# Patient Record
Sex: Female | Born: 1960 | Race: White | Hispanic: No | Marital: Married
Health system: Southern US, Community
[De-identification: ages and names within clinical notes are randomized; demographics above are authoritative.]

---

## 2012-11-30 ENCOUNTER — Inpatient Hospital Stay: Payer: Self-pay | Admitting: Internal Medicine

## 2012-11-30 LAB — CBC
MCH: 23.1 pg — ABNORMAL LOW (ref 26.0–34.0)
MCV: 73 fL — ABNORMAL LOW (ref 80–100)
Platelet: 502 10*3/uL — ABNORMAL HIGH (ref 150–440)
WBC: 7.8 10*3/uL (ref 3.6–11.0)

## 2012-11-30 LAB — BASIC METABOLIC PANEL
Anion Gap: 8 (ref 7–16)
Chloride: 106 mmol/L (ref 98–107)
Co2: 24 mmol/L (ref 21–32)
EGFR (African American): >60
EGFR (Non-African Amer.): >60
Glucose: 89 mg/dL (ref 65–99)
Osmolality: 273 (ref 275–301)
Sodium: 138 mmol/L (ref 136–145)

## 2012-11-30 LAB — APTT: Activated PTT: 39.4 secs — ABNORMAL HIGH (ref 23.6–35.9)

## 2012-11-30 LAB — CK TOTAL AND CKMB (NOT AT ARMC)
CK, Total: 174 U/L (ref 21–215)
CK-MB: 16.4 ng/mL — ABNORMAL HIGH (ref 0.5–3.6)

## 2012-11-30 LAB — TROPONIN I
Troponin-I: 1.1 ng/mL — ABNORMAL HIGH
Troponin-I: 4.8 ng/mL — ABNORMAL HIGH

## 2012-11-30 LAB — PROTIME-INR: INR: 1

## 2012-12-01 LAB — CBC WITH DIFFERENTIAL/PLATELET
Basophil #: 0 10*3/uL (ref 0.0–0.1)
Basophil %: 0.6 %
Eosinophil %: 2.5 %
HGB: 9 g/dL — ABNORMAL LOW (ref 12.0–16.0)
Lymphocyte %: 31.4 %
MCH: 23.1 pg — ABNORMAL LOW (ref 26.0–34.0)
MCV: 75 fL — ABNORMAL LOW (ref 80–100)
Monocyte %: 8.8 %
Neutrophil %: 56.7 %
Platelet: 460 10*3/uL — ABNORMAL HIGH (ref 150–440)
WBC: 8.4 10*3/uL (ref 3.6–11.0)

## 2012-12-01 LAB — HEMOGLOBIN A1C: Hemoglobin A1C: 5.3 % (ref 4.2–6.3)

## 2012-12-01 LAB — APTT
Activated PTT: >160 secs (ref 23.6–35.9)
Activated PTT: 79.8 secs — ABNORMAL HIGH (ref 23.6–35.9)

## 2012-12-01 LAB — BASIC METABOLIC PANEL
Anion Gap: 4 — ABNORMAL LOW (ref 7–16)
BUN: 8 mg/dL (ref 7–18)
Calcium, Total: 8.6 mg/dL (ref 8.5–10.1)
EGFR (African American): >60
Glucose: 115 mg/dL — ABNORMAL HIGH (ref 65–99)
Potassium: 4.3 mmol/L (ref 3.5–5.1)
Sodium: 137 mmol/L (ref 136–145)

## 2012-12-01 LAB — LIPID PANEL
HDL Cholesterol: 39 mg/dL — ABNORMAL LOW (ref 40–60)
Ldl Cholesterol, Calc: 71 mg/dL (ref 0–100)

## 2012-12-01 LAB — TROPONIN I: Troponin-I: 1.7 ng/mL — ABNORMAL HIGH

## 2012-12-01 LAB — CK TOTAL AND CKMB (NOT AT ARMC): CK-MB: 10.6 ng/mL — ABNORMAL HIGH (ref 0.5–3.6)

## 2012-12-19 LAB — COMPREHENSIVE METABOLIC PANEL
Alkaline Phosphatase: 113 U/L (ref 50–136)
BUN: 10 mg/dL (ref 7–18)
Calcium, Total: 8.7 mg/dL (ref 8.5–10.1)
Chloride: 111 mmol/L — ABNORMAL HIGH (ref 98–107)
Co2: 21 mmol/L (ref 21–32)
Creatinine: 0.86 mg/dL (ref 0.60–1.30)
EGFR (Non-African Amer.): >60
Glucose: 84 mg/dL (ref 65–99)
Osmolality: 281 (ref 275–301)
SGPT (ALT): 17 U/L (ref 12–78)
Sodium: 142 mmol/L (ref 136–145)
Total Protein: 7.1 g/dL (ref 6.4–8.2)

## 2012-12-19 LAB — CBC
HGB: 9 g/dL — ABNORMAL LOW (ref 12.0–16.0)
MCHC: 32.1 g/dL (ref 32.0–36.0)
MCV: 73 fL — ABNORMAL LOW (ref 80–100)
Platelet: 539 10*3/uL — ABNORMAL HIGH (ref 150–440)
RBC: 3.84 10*6/uL (ref 3.80–5.20)
WBC: 7.6 10*3/uL (ref 3.6–11.0)

## 2012-12-19 LAB — PROTIME-INR
INR: 1
Prothrombin Time: 13.8 secs (ref 11.5–14.7)

## 2012-12-19 LAB — CK TOTAL AND CKMB (NOT AT ARMC)
CK, Total: 52 U/L (ref 21–215)
CK-MB: 0.7 ng/mL (ref 0.5–3.6)

## 2012-12-19 LAB — APTT: Activated PTT: 39.3 secs — ABNORMAL HIGH (ref 23.6–35.9)

## 2012-12-20 ENCOUNTER — Observation Stay: Payer: Self-pay | Admitting: Internal Medicine

## 2012-12-20 LAB — LIPID PANEL
HDL Cholesterol: 36 mg/dL — ABNORMAL LOW (ref 40–60)
Ldl Cholesterol, Calc: 81 mg/dL (ref 0–100)

## 2012-12-20 LAB — TROPONIN I
Troponin-I: 0.03 ng/mL
Troponin-I: <0.02 ng/mL

## 2012-12-20 LAB — CBC WITH DIFFERENTIAL/PLATELET
Basophil #: 0.1 10*3/uL (ref 0.0–0.1)
Eosinophil #: 0.1 10*3/uL (ref 0.0–0.7)
Eosinophil %: 0.6 %
HCT: 26.3 % — ABNORMAL LOW (ref 35.0–47.0)
HGB: 8.3 g/dL — ABNORMAL LOW (ref 12.0–16.0)
Lymphocyte #: 1.3 10*3/uL (ref 1.0–3.6)
MCH: 23.3 pg — ABNORMAL LOW (ref 26.0–34.0)
MCHC: 31.6 g/dL — ABNORMAL LOW (ref 32.0–36.0)
MCV: 74 fL — ABNORMAL LOW (ref 80–100)
Monocyte #: 0.6 x10 3/mm (ref 0.2–0.9)
Neutrophil %: 82 %
Platelet: 516 10*3/uL — ABNORMAL HIGH (ref 150–440)
RDW: 20.8 % — ABNORMAL HIGH (ref 11.5–14.5)
WBC: 11.1 10*3/uL — ABNORMAL HIGH (ref 3.6–11.0)

## 2012-12-20 LAB — BASIC METABOLIC PANEL
Anion Gap: 6 — ABNORMAL LOW (ref 7–16)
BUN: 8 mg/dL (ref 7–18)
Chloride: 110 mmol/L — ABNORMAL HIGH (ref 98–107)
Co2: 22 mmol/L (ref 21–32)
Creatinine: 0.63 mg/dL (ref 0.60–1.30)
EGFR (Non-African Amer.): >60
Glucose: 111 mg/dL — ABNORMAL HIGH (ref 65–99)
Osmolality: 275 (ref 275–301)
Potassium: 4.5 mmol/L (ref 3.5–5.1)
Sodium: 138 mmol/L (ref 136–145)

## 2012-12-20 LAB — CK TOTAL AND CKMB (NOT AT ARMC): CK, Total: 45 U/L (ref 21–215)

## 2013-01-16 ENCOUNTER — Observation Stay: Payer: Self-pay | Admitting: Internal Medicine

## 2013-01-16 LAB — TROPONIN I
Troponin-I: <0.02 ng/mL
Troponin-I: <0.02 ng/mL

## 2013-01-16 LAB — CBC
HCT: 30.1 % — ABNORMAL LOW (ref 35.0–47.0)
HGB: 9.5 g/dL — ABNORMAL LOW (ref 12.0–16.0)
MCH: 23.1 pg — ABNORMAL LOW (ref 26.0–34.0)
Platelet: 570 10*3/uL — ABNORMAL HIGH (ref 150–440)
RBC: 4.1 10*6/uL (ref 3.80–5.20)
RDW: 21.6 % — ABNORMAL HIGH (ref 11.5–14.5)

## 2013-01-16 LAB — DRUG SCREEN, URINE
Amphetamines, Ur Screen: NEGATIVE (ref ?–1000)
Cocaine Metabolite,Ur ~~LOC~~: NEGATIVE (ref ?–300)
MDMA (Ecstasy)Ur Screen: NEGATIVE (ref ?–500)
Opiate, Ur Screen: POSITIVE (ref ?–300)
Phencyclidine (PCP) Ur S: NEGATIVE (ref ?–25)

## 2013-01-16 LAB — URINALYSIS, COMPLETE
Bilirubin,UR: NEGATIVE
Blood: NEGATIVE
Ketone: NEGATIVE
Nitrite: NEGATIVE
Ph: 5 (ref 4.5–8.0)
Protein: NEGATIVE
RBC,UR: 12 /HPF (ref 0–5)
Specific Gravity: 1.043 (ref 1.003–1.030)
Squamous Epithelial: 8

## 2013-01-16 LAB — CK TOTAL AND CKMB (NOT AT ARMC)
CK, Total: 45 U/L (ref 21–215)
CK, Total: 42 U/L (ref 21–215)
CK-MB: 0.5 ng/mL (ref 0.5–3.6)
CK-MB: 0.6 ng/mL (ref 0.5–3.6)

## 2013-01-16 LAB — BASIC METABOLIC PANEL
Co2: 23 mmol/L (ref 21–32)
Creatinine: 0.97 mg/dL (ref 0.60–1.30)
EGFR (African American): >60
EGFR (Non-African Amer.): >60
Glucose: 82 mg/dL (ref 65–99)
Osmolality: 276 (ref 275–301)
Potassium: 3.7 mmol/L (ref 3.5–5.1)

## 2013-01-16 LAB — ETHANOL: Ethanol %: <0.003 % (ref 0.000–0.080)

## 2013-01-17 LAB — CBC WITH DIFFERENTIAL/PLATELET
Basophil #: 0.1 x10 3/mm 3
Basophil %: 0.6 %
Eosinophil #: 0.1 x10 3/mm 3
Eosinophil %: 1.3 %
HCT: 29.6 % — ABNORMAL LOW
HGB: 9.4 g/dL — ABNORMAL LOW
Lymphocyte %: 25.2 %
Lymphs Abs: 2.5 x10 3/mm 3
MCH: 23.3 pg — ABNORMAL LOW
MCHC: 31.7 g/dL — ABNORMAL LOW
MCV: 74 fL — ABNORMAL LOW
Monocyte #: 0.7 "x10 3/mm "
Monocyte %: 7.2 %
Neutrophil #: 6.6 x10 3/mm 3 — ABNORMAL HIGH
Neutrophil %: 65.7 %
Platelet: 551 x10 3/mm 3 — ABNORMAL HIGH
RBC: 4.02 X10 6/mm 3
RDW: 20.9 % — ABNORMAL HIGH
WBC: 10 x10 3/mm 3

## 2013-01-17 LAB — BASIC METABOLIC PANEL
Anion Gap: 4 — ABNORMAL LOW (ref 7–16)
BUN: 8 mg/dL (ref 7–18)
Calcium, Total: 9.1 mg/dL (ref 8.5–10.1)
Chloride: 106 mmol/L (ref 98–107)
Creatinine: 0.61 mg/dL (ref 0.60–1.30)
EGFR (African American): >60
EGFR (Non-African Amer.): >60
Glucose: 86 mg/dL (ref 65–99)
Osmolality: 268 (ref 275–301)

## 2013-01-17 LAB — LIPID PANEL
Cholesterol: 130 mg/dL
HDL Cholesterol: 42 mg/dL
Ldl Cholesterol, Calc: 76 mg/dL
Triglycerides: 60 mg/dL
VLDL Cholesterol, Calc: 12 mg/dL

## 2013-01-17 LAB — MAGNESIUM: Magnesium: 1.8 mg/dL

## 2014-03-01 IMAGING — CT CT HEAD WITHOUT CONTRAST
1 series · 16 of 30 positions shown, 20 images · non-contrast
Comparison: none

REASON FOR EXAM: large hematoma to right orbit region with severe
headache post fall 10 days ago
COMMENTS:

[Series 2: head wo · axial · 0.41mm/px · z∈[-36,+100]mm · 16 of 31 slices shown, 20 images]
[im 2/31  brain]
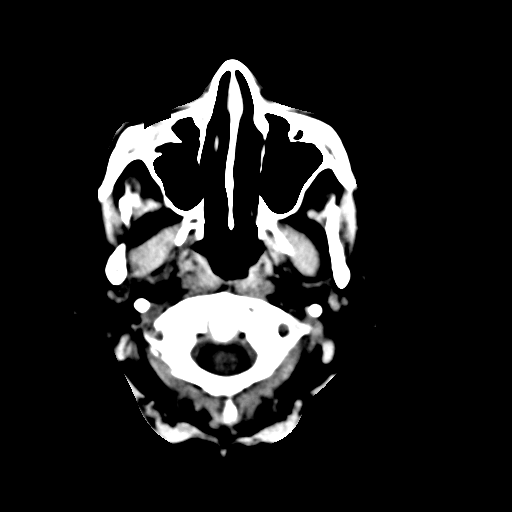
[im 2/31  bone]
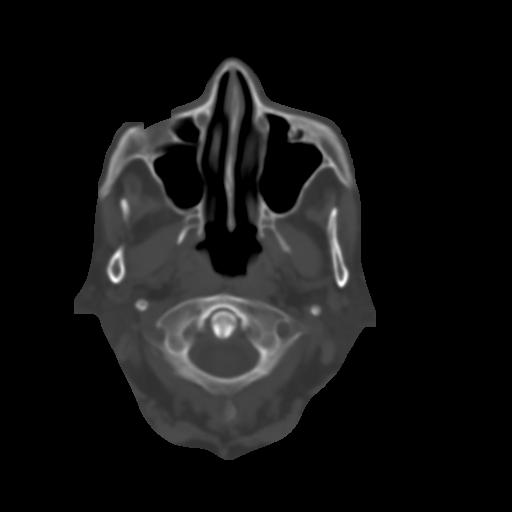
[im 4/31  brain]
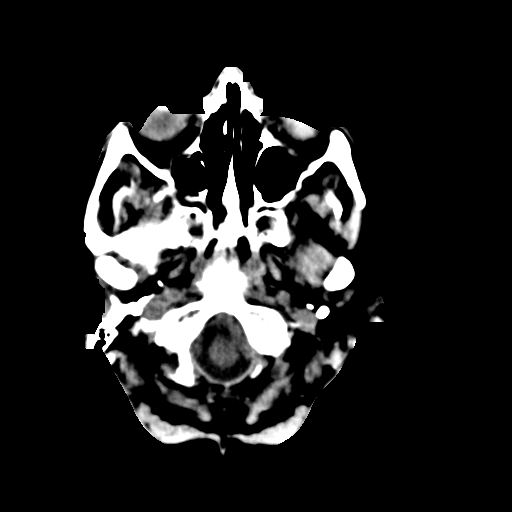
[im 6/31  brain]
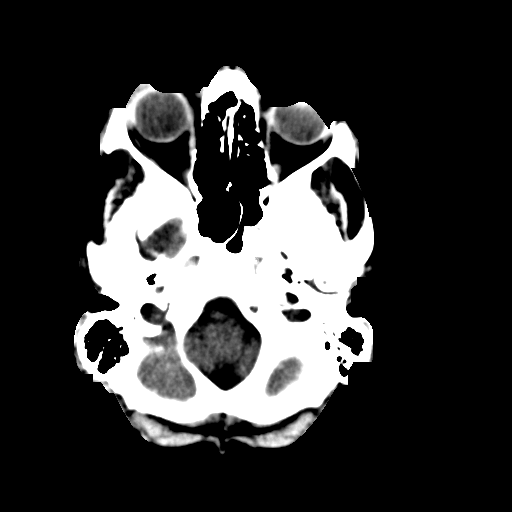
[im 8/31  brain]
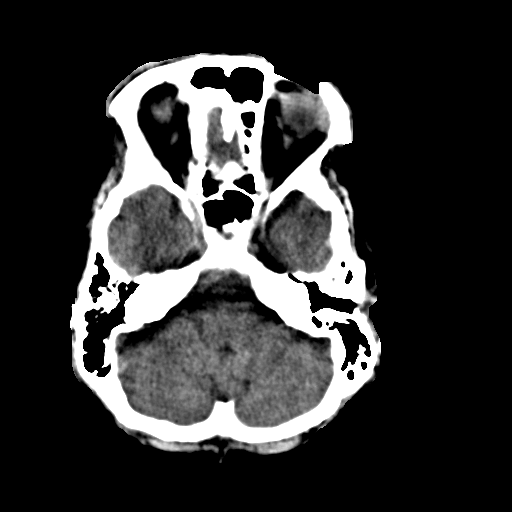
[im 9/31  brain]
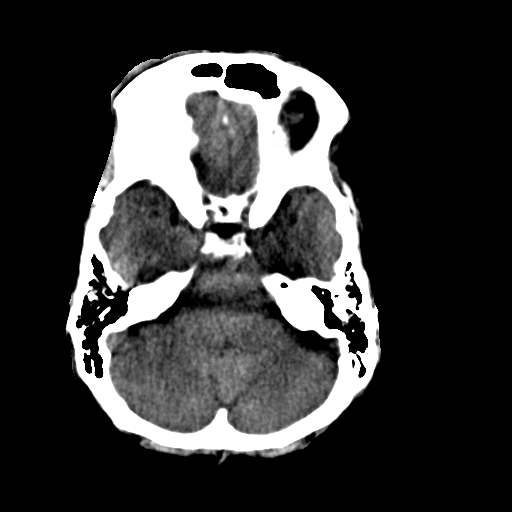
[im 9/31  bone]
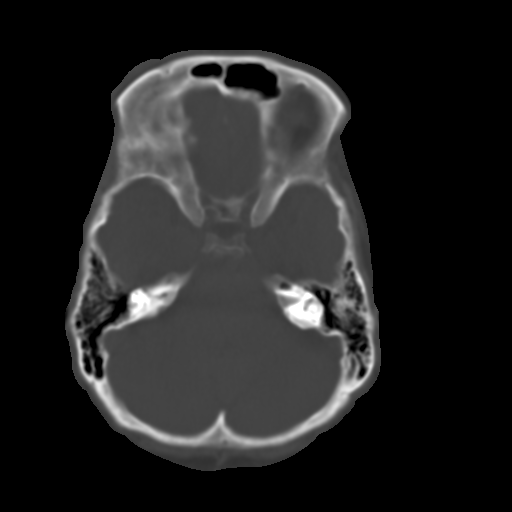
[im 11/31  brain]
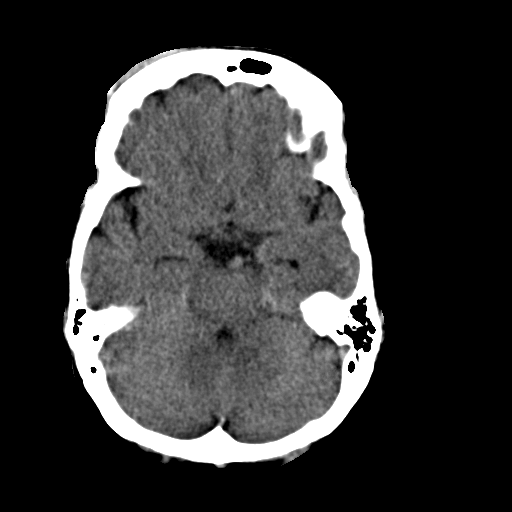
[im 13/31  brain]
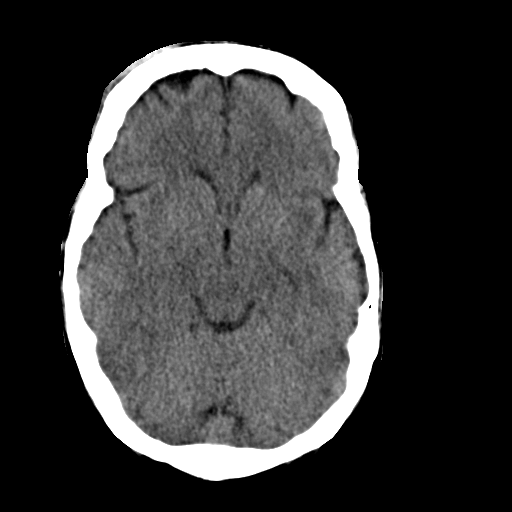
[im 15/31  brain]
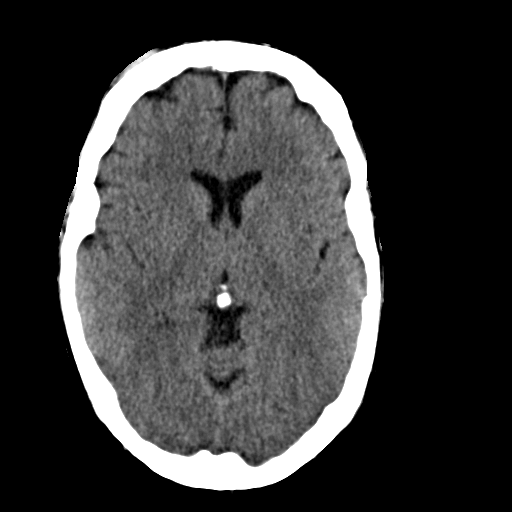
[im 16/31  brain]
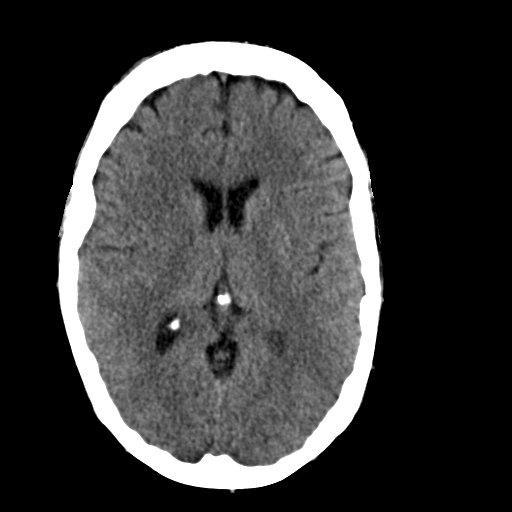
[im 16/31  bone]
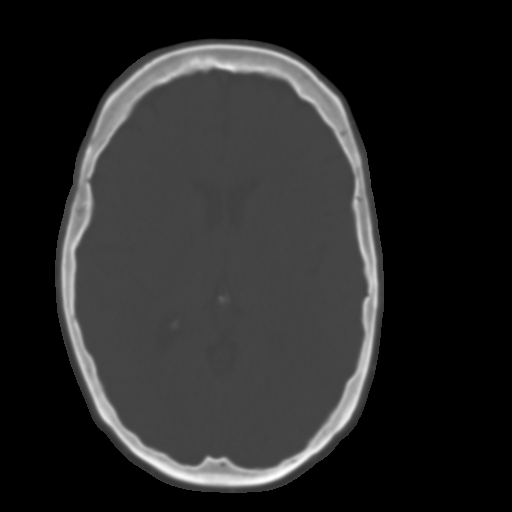
[im 18/31  brain]
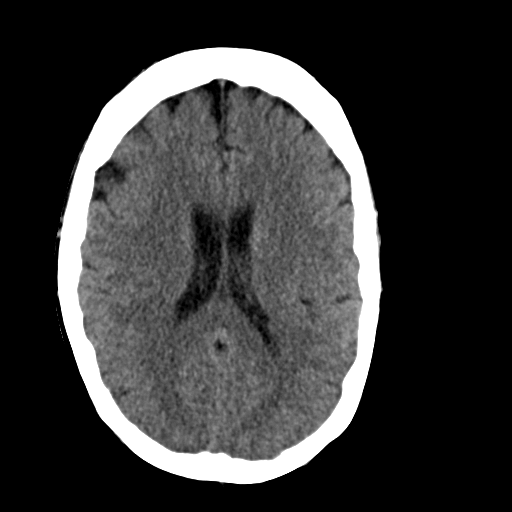
[im 20/31  brain]
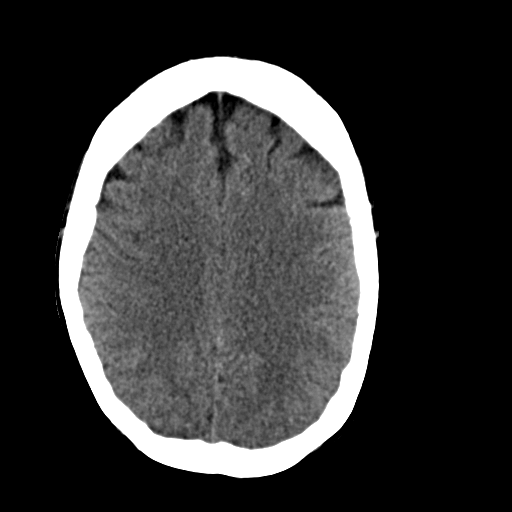
[im 22/31  brain]
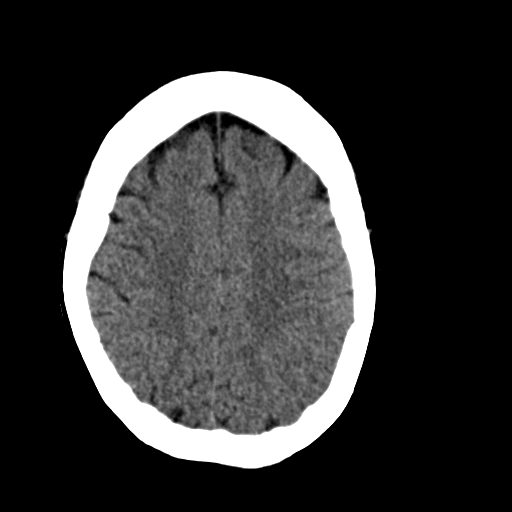
[im 23/31  brain]
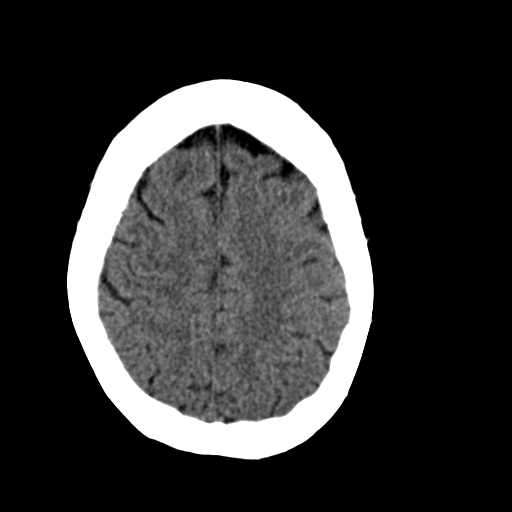
[im 23/31  bone]
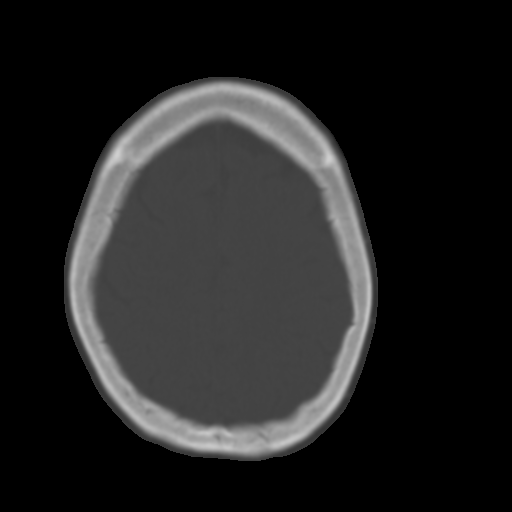
[im 25/31  brain]
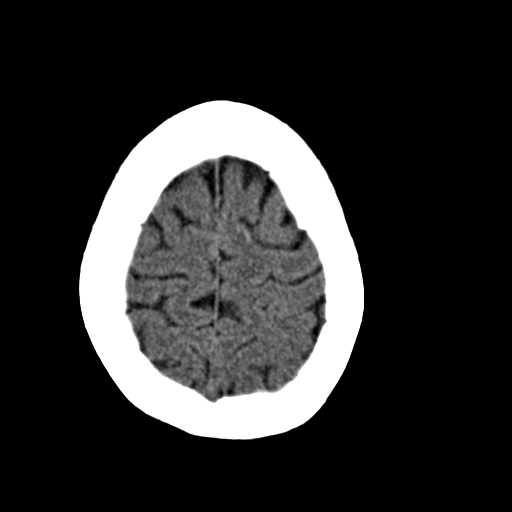
[im 27/31  brain]
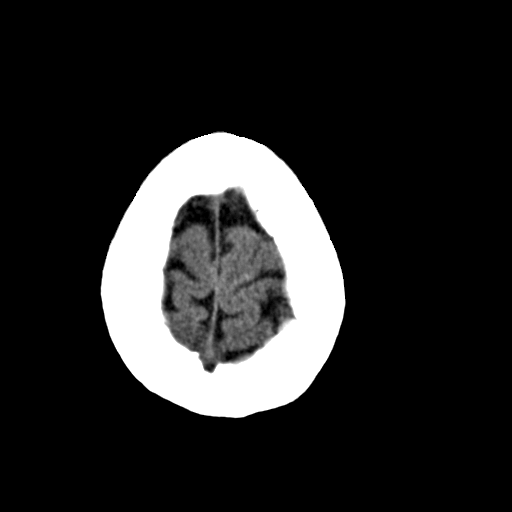
[im 29/31  brain]
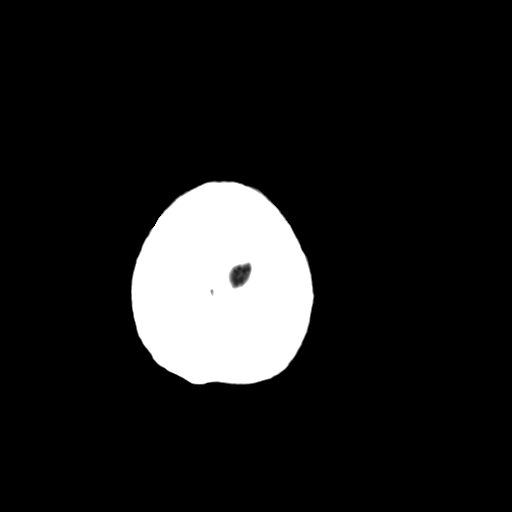

[16 of 30 positions shown; findings below may reference images not displayed]

PROCEDURE:     CT  - CT HEAD WITHOUT CONTRAST  - January 16, 2013  [DATE]

RESULT:     Axial noncontrast CT scanning was performed through the brain
with reconstructions at 5 mm intervals and slice thicknesses.

The ventricles are normal in size and position. There is no intracranial
hemorrhage nor intracranial masseffect. The cerebellum and brainstem are
normal in density. There are no abnormal intracranial calcifications.

There is a cephalohematoma over the right aspect of the forehead. There is
no underlying skull fracture. The observed portions of the paranasal sinuses
and mastoid air cells are clear.
IMPRESSION: 1. There is no evidence of an acute intracranial hemorrhage.
2. There is no intracranial mass effect or evidence of an evolving ischemic
infarction.
3. There is a cephalohematoma over the right forehead without evidence of an
underlying fracture.

[REDACTED]

## 2014-03-01 IMAGING — CR DG CHEST 1V PORT
1 series · 1 of 1 positions shown · non-contrast
Comparison: none

REASON FOR EXAM: Chest Pain
COMMENTS:

PROCEDURE:     DXR - DXR PORTABLE CHEST SINGLE VIEW  - January 16, 2013  [DATE]
RESULT:     The lungs are clear. The cardiac silhouette and visualized bony
skeleton are unremarkable.

[ap]
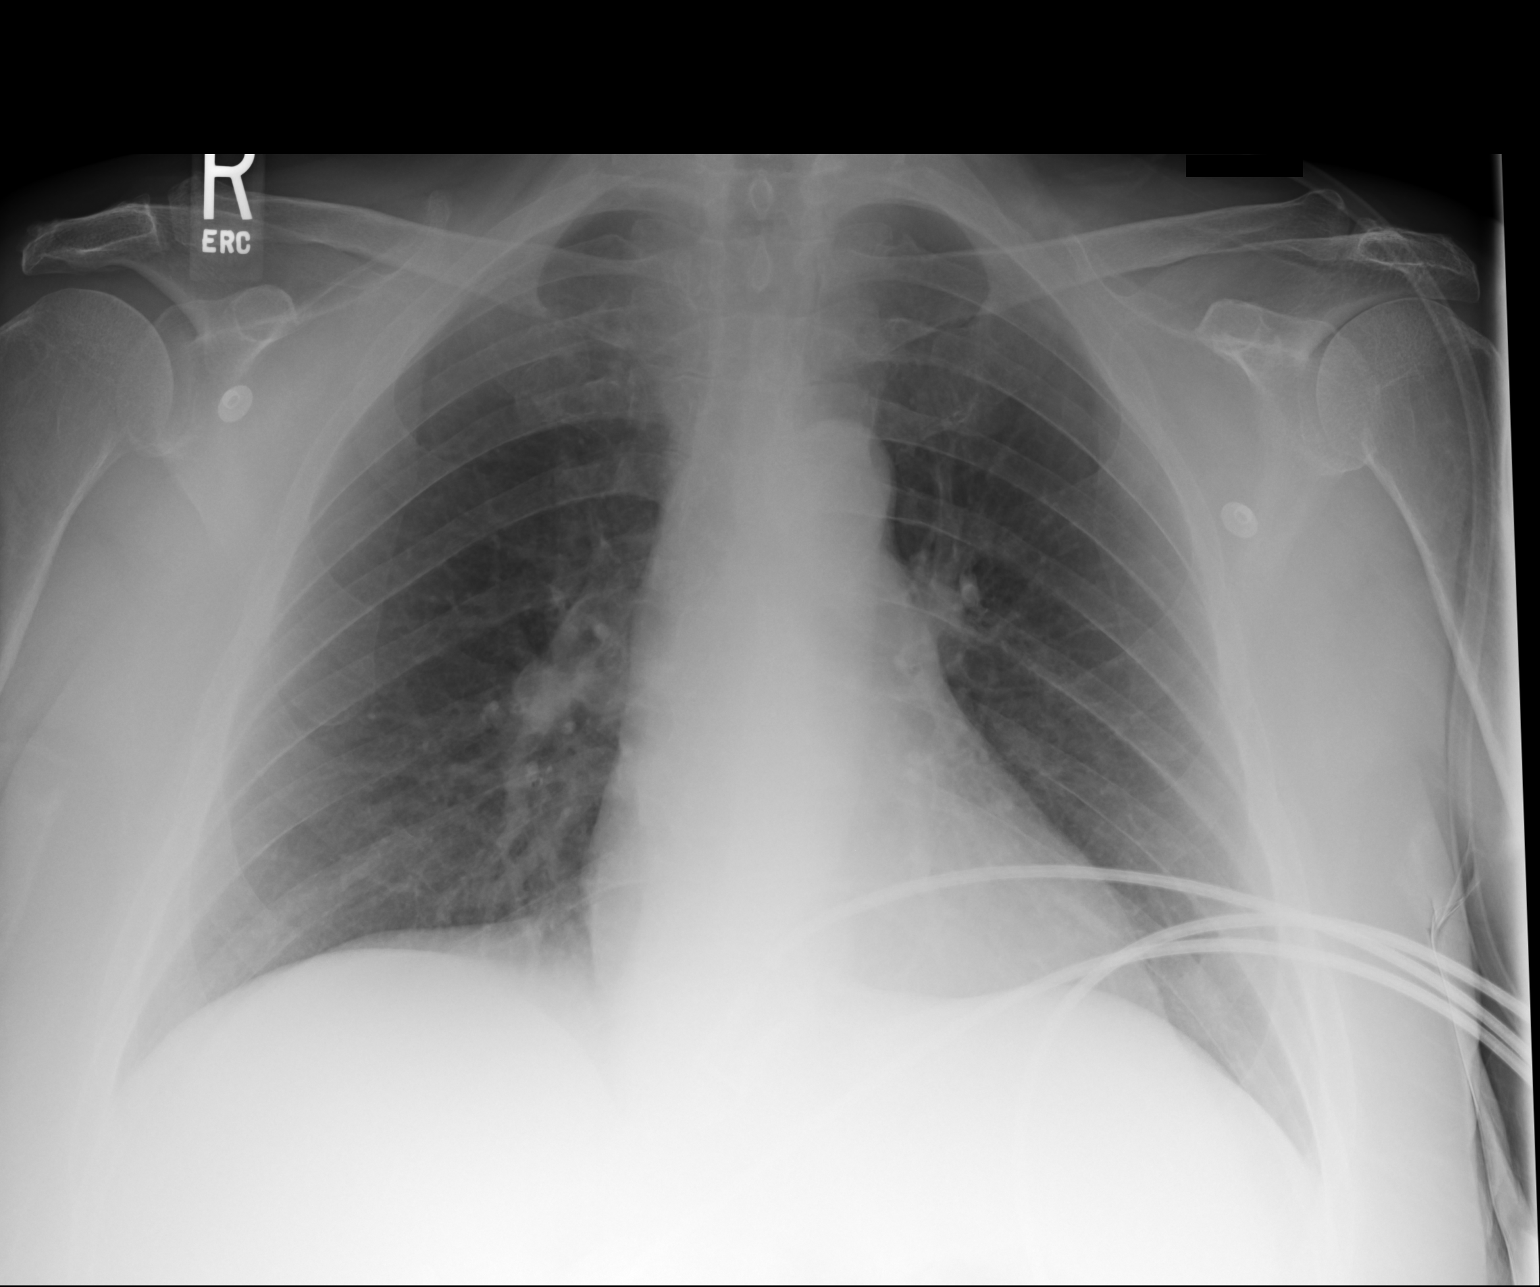

[1 of 1 positions shown; findings below may reference images not displayed]

IMPRESSION: 1. Chest radiograph without evidence of acute cardiopulmonary disease.
2. Comparison 12/19/2012

## 2014-07-06 NOTE — Consult Note (Signed)
General Aspect 54 year old female that presented to the ER for jaw, right arm and back discomfort. Patient states she tripped over her cat approximally 10 days ago and has a yellowish green tense hematoma over the right  eye with some bruising on the right cheek. She states she fell and hit the corner of the table and loss consciousness.  She does have history of having a cardiac catheterization about a month ago with normal coronaries after she also presented with chest pain and had one positive troponin.  So far her EKG is negative for acute changes and her first troponin is negative.  She has also had a CT of the head and a CT of the chest both without significant findings.  Patient is in the ER crying, states she got up to use the bathroom and fell on the floor.  The fall was not witnessed.  She states mostly her head is hurting, although she continues with right arm, jaw and back discomfort. She has had Dilaudid per her ER nurse x3 doses and still states she has all the above pain.  Dr. Nehemiah Massed earlier today did suggest trying Isosorbide in case patient was having a vasospasm. History of gastric bypass, patient states 10 years ago.   Physical Exam:  GEN disheveled, Crying   HEENT hearing intact to voice, Hematoma over right eye   NECK supple   RESP normal resp effort  clear BS   CARD Regular rate and rhythm  Normal, S1, S2   ABD denies tenderness  soft   EXTR negative edema   SKIN normal to palpation   NEURO cranial nerves intact, motor/sensory function intact   PSYCH alert, poor insight, anxious, Crying   Review of Systems:  Subjective/Chief Complaint Right arm, shoulder,Jaw, back pain, Head pain   General: Upset crying   Neck: Complaining of right side hurting   Neurologic: Dizzness  Headache   Psychiatric: Crying   Review of Systems: All other systems were reviewed and found to be negative   Medications/Allergies Reviewed Medications/Allergies reviewed   Lab  Results:  Routine Chem:  03-Nov-14 02:46   Ethanol, S. < 3  Ethanol % (comp) < 0.003 (Result(s) reported on 16 Jan 2013 at 06:04AM.)  BUN 17  Creatinine (comp) 0.97  Sodium, Serum 138  Potassium, Serum 3.7  Chloride, Serum  109  CO2, Serum 23  Calcium (Total), Serum 8.8  Anion Gap  6  Osmolality (calc) 276  eGFR (African American) >60  eGFR (Non-African American) >60 (eGFR values <40m/min/1.73 m2 may be an indication of chronic kidney disease (CKD). Calculated eGFR is useful in patients with stable renal function. The eGFR calculation will not be reliable in acutely ill patients when serum creatinine is changing rapidly. It is not useful in  patients on dialysis. The eGFR calculation may not be applicable to patients at the low and high extremes of body sizes, pregnant women, and vegetarians.)  Cardiac:  03-Nov-14 02:46   Troponin I < 0.02 (0.00-0.05 0.05 ng/mL or less: NEGATIVE  Repeat testing in 3-6 hrs  if clinically indicated. >0.05 ng/mL: POTENTIAL  MYOCARDIAL INJURY. Repeat  testing in 3-6 hrs if  clinically indicated. NOTE: An increase or decrease  of 30% or more on serial  testing suggests a  clinically important change)  Routine Coag:  03-Nov-14 02:46   D-Dimer, Quantitative  0.54 ("If the D-dimer test is being used to assist in the exclusion of DVT and/or PE, note the following:  In various studies concerning  the D-dimer methodology (STA Liatest) in use by this laboratory, it has been reported that with a cut-off value of 0.50 ug/mL FEU, the  negative predictive value regarding the exclusion of thrombosis is within the 95-100% range."  In patients with high pre-test probability of DVT/PE the results of the D-dimer test should be correlated with other diagnostic and clinical assessment modalities. Reference: Gruver., 2005.)  Routine Hem:  03-Nov-14 02:46   WBC (CBC) 8.3  RBC (CBC) 4.10  Hemoglobin (CBC)  9.5  Hematocrit (CBC)  30.1   Platelet Count (CBC)  570 (Result(s) reported on 16 Jan 2013 at 02:59AM.)  MCV  74  MCH  23.1  MCHC  31.4  RDW  21.6   Radiology Results:  XRay:    03-Nov-14 02:19, Chest Portable Single View  Chest Portable Single View   REASON FOR EXAM:    Chest Pain  COMMENTS:       PROCEDURE: DXR - DXR PORTABLE CHEST SINGLE VIEW  - Jan 16 2013  2:19AM     RESULT: The lungs are clear. The cardiac silhouette and visualized bony   skeleton are unremarkable.    IMPRESSION:      1. Chest radiograph without evidence of acute cardiopulmonary disease.   2. Comparison 12/19/2012        Verified By: Mikki Santee, M.D., MD    Morphine: Rash  Ketorolac: Rash  Vital Signs/Nurse's Notes:  **Vital Signs.:   03-Nov-14 12:00  Vital Signs Type Admission  Temperature Temperature (F) 98.2  Temperature Source oral  Pulse Pulse 99  Respirations Respirations 18  Systolic BP Systolic BP 480  Diastolic BP (mmHg) Diastolic BP (mmHg) 88  Mean BP 104  Pulse Ox % Pulse Ox % 98  Pulse Ox Activity Level  At rest  Oxygen Delivery Room Air/ 21 %    Impression 54 year old female with history of cardiac catheterization one month ago with normal coronary arteries, presenting with  head trauma  from fall at home several days ago, with associated right arm discomfort, right jaw and right back pain, with symptoms unrelieved with several doses of Dilaudid/isosorbide, currently with emotional distress/ crying and with clinical picture sounding atypical for CAD.   Plan 1.  Will continue to follow serial enzymes, symptoms and effects of adding isosorbide. No further cardiac diagnostics needed at this time.  Further review and recommendations per Dr. Nehemiah Massed.   Electronic Signatures: Roderic Palau (NP)  (Signed [phone] 13:34)  Authored: General Aspect/Present Illness, History and Physical Exam, Review of System, Home Medications, Labs, Radiology, Allergies, Vital Signs/Nurse's Notes, Impression/Plan   Last  Updated: 03-Nov-14 13:34 by Roderic Palau (NP)

## 2014-07-06 NOTE — H&P (Signed)
PATIENT NAME:  [name] Moss, [name] Moss MR#:  [id number] DATE OF BIRTH:  June 28, 1960  PRIMARY CARE PHYSICIAN: None.   CHIEF COMPLAINT: Chest pain.   HISTORY OF PRESENT ILLNESS: A 54 year old female patient with history of chronic knee pain who goes to [facility] pain clinic who presents to the Emergency Room complaining of acute onset of chest pain earlier today which is still persistent. The patient had a dose of aspirin with EMS along with 3 pills of nitroglycerin after which she has had severe headache. She still is holding on to her chest, complaining of pain which radiates to the right side. No aggravating or relieving factors. Did improve mildly with the nitroglycerin pills.   The patient has had similar pain over the past few days lasting about 15 minutes for which she did not seek medical care, as it resolved. She does have nausea with these episodes. No shortness of breath, syncope, vomiting, or palpitations. The patient yesterday in a hot shower had some dizziness and fell down, hitting her chin which has a bruise. Did not hit her chest. Does not complain of any pleuritic chest pain. Did not black out. No syncope. Never had any cardiac testing. No premature coronary artery disease in the family.   PAST MEDICAL AND SURGICAL HISTORY:  Include: 1.  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  2.  Gastric bypass.  3.  Hernia repair.  4.  Cholecystectomy.  5.  Hysterectomy.   SOCIAL HISTORY: The patient does not smoke. No alcohol. No illicit drugs. Lives at home with her husband.   ALLERGIES: MORPHINE WHICH CAUSES RASH.   FAMILY HISTORY: Coronary artery disease in her mother who had an MI 3 years back. No premature coronary artery disease in the family.   HOME MEDICATIONS:  Include:  1.  Tramadol 50 mg one to 2 tablets orally every 8 hours as needed for pain.  2.  Tizanidine 2 mg three tablets oral every 8 hours.  3.  Amitriptyline 150 mg oral once a day.   REVIEW OF SYSTEMS:  CONSTITUTIONAL:  No fatigue, weakness.  EYES: No blurred vision, pain or redness.  ENT: No tinnitus, ear pain, hearing loss.  RESPIRATORY: No cough, wheeze, hemoptysis.  CARDIOVASCULAR: Complains of chest pain. No edema, orthopnea.  GASTROINTESTINAL: Has nausea. No vomiting, diarrhea, abdominal pain.  GENITOURINARY: No dysuria, hematuria, frequency.  ENDOCRINE: No polyuria, nocturia, thyroid problems.  HEMOLYMPHATIC: Has chronic anemia. No easy bruising, bleeding.  INTEGUMENTARY: No acne, rash, lesions.  MUSCULOSKELETAL: Has chronic knee pain. Had knee replacement.  NEUROLOGIC: No focal numbness, weakness, dysarthria.  PSYCHIATRY: No anxiety or depression.   PHYSICAL EXAMINATION:  VITAL SIGNS: Temperature 99.2, pulse 89, blood pressure 140/92, saturating 100% on room air.  GENERAL: Moderately built Caucasian female patient lying in bed in significant distress secondary to her chest pain, holding on to her chest.  PSYCHIATRIC: Alert and oriented x3, anxious.  HEENT: Atraumatic, normocephalic. Oral mucosa moist and pink. External ears and nose normal. No pallor. No icterus.  NECK: Supple. No thyromegaly. No palpable lymph nodes. Trachea midline. No carotid bruit, JVD.  CARDIOVASCULAR: S1, S2, regular. No murmurs. No edema.  RESPIRATORY: Normal work of breathing. Clear to auscultation on both sides. No chest wall tenderness.  GASTROINTESTINAL: Soft abdomen, nontender. Bowel sounds present. No hepatosplenomegaly palpable.  SKIN: Warm and dry. No petechiae, rash, ulcers.  MUSCULOSKELETAL: No joint swelling, redness, effusion of the lower joints. Normal muscle tone.  NEUROLOGICAL: Motor strength 5/5 in upper and lower extremities.  LYMPHATIC: No  cervical lymphadenopathy.   LABORATORIES: Glucose 89, BUN 7, creatinine 0.73, sodium 138, potassium 4. CK 95, CK-MB 2.8, troponin 1.1.   WBC 7.8, hemoglobin 10.2, platelets 502, MCV 73.   INR 1.   EKG shows normal sinus rhythm. No acute ST-T-wave changes.    Chest x-ray shows no acute abnormalities. No hip fractures.  ASSESSMENT AND PLAN:  1.  Non-ST-segment elevation myocardial infarction in a patient with ongoing acute chest pain and elevated troponin. Although the patient does not have any risk factors, she does have  typical symptoms at this time. She does have non-ST-elevation myocardial infarction. We will start her on a nitroglycerin drip along with heparin drip STAT. We will also give her an intravenous dose of Lopressor. THE PATIENT IS ALLERGIC TO MORPHINE. Will be given a small dose of Dilaudid along with aspirin and statin. I discussed the case with Dr. Gwen PoundsKowalski who will see the patient. The patient will need cardiac catheter. A pulmonary embolism was considered, but she does not have any tachycardia, hypoxia and no risk factors for pulmonary embolism. Also a cardiac contusion might be a possibility, but she does not have any rib fractures, no local tenderness, and she did not hit her chest when she fell.  2.  Elevated blood pressure without diagnosis of hypertension. She might have undiagnosed hypertension or elevated blood pressure secondary to the pain. Will give a low-dose IV Lopressor at this time. Start her on metoprolol b.i.d. Blood pressure will be monitored.  3.  Chronic pain. Continue home medications.  4.  Microcytic anemia. She does mention that she has chronic anemia. We will get iron studies. No history of gastrointestinal bleed.  5.  Deep vein thrombosis prophylaxis. The patient will be on a heparin drip.   CODE STATUS: FULL CODE.   Time spent today on this critically ill patient being admitted to CCU with NSTEMI with ongoing chest pain is 50 minutes.   ____________________________ Molinda BailiffSrikar R. Hadlea Furuya, MD srs:np D: 11/30/2012 15:44:17 ET T: 11/30/2012 15:54:46 ET JOB#: [id number]  cc: Wardell HeathSrikar R. Elpidio AnisSudini, MD, <Dictator> Lamar BlinksBruce J. Kowalski, MD Orie FishermanSRIKAR R Linford Quintela MD ELECTRONICALLY SIGNED 12/05/2012 10:41

## 2014-07-06 NOTE — Discharge Summary (Signed)
PATIENT NAME:  [name] Moss, [name] Moss MR#:  [id number] DATE OF BIRTH:  12-09-1960  DATE OF ADMISSION:  12/19/2012 DATE OF DISCHARGE:  12/20/2012  ADMISSION DIAGNOSIS: Chest pain.   DISCHARGE DIAGNOSES: Chest pain thought to be possibly due to coronary vasospasm versus musculoskeletal strain.   CONSULTATIONS:  Dr. Gwen PoundsKowalski.   PERTINENT LABORATORY AND IMAGING DATA: Troponins were negative.   White blood cells 11, hemoglobin 8.3, hematocrit 27, platelets of 516. Magnesium 1.5, which was repleted prior to discharge. LDL 81, HDL 36, cholesterol 131, triglycerides 69, VLDL 14.   A 2-D echocardiogram showed a normal ejection fraction of 65% to 70%. No other wall motion abnormalities.   HOSPITAL COURSE: This is a 54 year old female who had a recent cardiac catheterization, which showed normal coronary arteries, who was admitted for chest pain. For further details, please refer to the H and P.  1.  Chest pain. The patient was started on nitroglycerin drip. She had a cardiac catheterization just recently under Dr. Gwen PoundsKowalski, which showed normal coronary arteries. I did speak to Dr. Gwen PoundsKowalski. He did not feel that this was related to a cardiac issue. He thought possibly the patient could have some coronary vasospasms, but it was unclear. He thought more particularly this probably is related to musculoskeletal strain. However, due to the unclear etiology of her chest pain, she was continued on nifedipine and Imdur was added, just in case this was  coronary vasospasm. Her echo showed no wall motion abnormalities with a normal ejection fraction.  2.  Hypertension was controlled.  3.  Hypokalemia and hypomagnesemia, which was repleted.   DISCHARGE MEDICATIONS: 1.  Tramadol 50 mg 1 to 2 tablets q.8 hours p.r.n. pain.  2.  Amitriptyline 150 mg 1 tablet at bedtime.  3.  Tizanidine 2 mg 3 tablets q.8 hours p.r.n. spasms.  4.  Aspirin 81 mg daily.  5.  Nifedipine 30 mg daily.  6.  Nitroglycerin sublingual p.r.n.  chest pain.  7.  Naproxen 250 b.i.d.  8.  Imdur 30 mg daily.  9.  Ferrous sulfate 325 mg b.i.d.   DISCHARGE DIET: Low sodium diet.   DISCHARGE ACTIVITY: As tolerated.   DISCHARGE FOLLOWUP:  The patient will follow up with Dr. Gwen PoundsKowalski in 1 week.   TIME SPENT: Approximately 35 minutes.   The patient is medically stable for discharge.   ____________________________ Minoru Chap P. Juliene PinaMody, MD spm:dmm D: 12/20/2012 12:24:17 ET T: 12/20/2012 12:42:51 ET JOB#: [id number]  cc: Ladena Jacquez P. Juliene PinaMody, MD, <Dictator> Lamar BlinksBruce J. Kowalski, MD Janyth ContesSITAL P Aleister Lady MD ELECTRONICALLY SIGNED 12/20/2012 20:48

## 2014-07-06 NOTE — H&P (Signed)
PATIENT NAME:  [name] Moss, [name] Moss MR#:  [id number] DATE OF BIRTH:  [birth year]  DATE OF ADMISSION:  01/16/2013  PRIMARY CARE PHYSICIAN: None.   REFERRING PHYSICIAN: Dr. Ladona Ridgel  CARDIOLOGIST: Dr. Gwen Pounds  CHIEF COMPLAINT: Chest pain.   HISTORY OF PRESENT ILLNESS: The patient is a 54 year old Caucasian female with a past medical history of chronic knee pain and hypertension admitted to the hospital 1 month ago, in October, for chest pain and elevated troponin and had a cardiac catheterization with no blockages and normal ejection fraction, is presenting to the ER with the chief complaint of right-sided chest pain radiating to the jaw and back. The patient is reporting that this pain is pressure. At around 2:00 p.m. yesterday, she started having chest pressure on the right side and she took 1 sublingual nitroglycerin and felt better. Following that the pain recurred at 6:00 p.m. and the patient tried to ignore the pain, but at 10:00 p.m. as it was worse she tried sublingual nitroglycerin, 3 doses, with no significant improvement. The patient came into the ER and here the first set of troponin is negative. EKG did not show any significant changes. As the patient is complaining of excruciating pressure radiating to the jaw and the back, CT angiogram of the chest is done and pulmonary embolism is ruled out. Also, as the patient recently fell and had big bruising and swelling on the right side of the forehead, CT head and CT neck were done which were also normal. Dr. Ladona Ridgel has called and discussed with Dr. Gwen Pounds who has recommended Imdur. Imdur was given and hospitalist team is called to admit the patient. During my examination, the patient's chest pain is slightly improved, from 9 out of 10 to 8 out of 10. Still complaining of chest pressure without any radiation, but associated with nausea, shortness of breath and dizziness. Denies any loss of consciousness. The patient is given nitro paste just now and will  see if that will= relieve the pain. If not, we will start her on nitro drip.  PAST MEDICAL HISTORY: Hypertension, chronic knee pain.   PAST SURGICAL HISTORY: Hysterectomy, cholecystectomy, hernia repair surgery, gastric bypass and cardiac catheterization in September 2014 which was negative for coronary artery blockages.   PSYCHOSOCIAL HISTORY: Lives at home with husband. Denies smoking, alcohol or illicit drug use.   FAMILY HISTORY: Coronary artery disease in mother who MI 3 years ago.  ALLERGIES: MORPHINE CAUSES A RASH.  REVIEW OF SYSTEMS: CONSTITUTIONAL: Denies any fever but complaining of weakness and tiredness.  EYES: Denies blurry vision or glaucoma.  EARS, NOSE, THROAT: No epistaxis or discharge.  RESPIRATORY: Denies cough, COPD. CARDIOVASCULAR: Complaining of chest pressure on the right side radiating to the jaw and to the back. Denies any syncope. GASTROINTESTINAL: Positive nausea. No vomiting or diarrhea or abdominal pain.  GENITOURINARY: No dysuria or hematuria. GYN: Denies breast mass or vaginal discharge.  ENDOCRINE: No polyuria, nocturia, thyroid problems.  HEMATOLOGIC AND LYMPHATIC: No anemia, easy bruising, bleeding.  INTEGUMENTARY: No acne, rash, lesions.  MUSCULOSKELETAL: No joint pain in the neck, but has chronic knee pains.  NEUROLOGIC: No vertigo or ataxia.  PSYCHIATRIC: No ADD or OCD.   PHYSICAL EXAMINATION: VITAL SIGNS: Temperature 98.5, pulse 92, respirations 22, blood pressure 164/85 and pulse ox 96%.  GENERAL APPEARANCE: Not in acute distress. Moderately built and thin-looking female.  HEENT: Normocephalic. She has a big bruise and swelling on the right side of the forehead as she sustained a fall recently. It is not bleeding,  but is tender to touch. No conjunctival injection. Extraocular movements are intact. No sinus tenderness. Moist mucous membranes.  NECK: Supple. No JVD or thyromegaly. Range of motion is intact.  LUNGS: Clear to auscultation  bilaterally. No accessory muscle use. No anterior chest wall tenderness on palpation.  HEART: S1, S2 normal. Regular rate and rhythm. No murmurs. ABDOMEN: Soft. Bowel sounds are positive in all 4 quadrants. Nontender, nondistended. No hepatosplenomegaly. No masses felt.  NEUROLOGIC: Awake, alert and oriented x 3. Cranial nerves are grossly intact. Motor and sensory grossly intact. Reflexes are 2+.  EXTREMITIES: No edema. No cyanosis. No clubbing.  SKIN: Warm to touch. Normal turgor. No rashes. No lesions.  MUSCULOSKELETAL: No joint effusion. No erythema. Peripheral pulses are 2+.  LABORATORY AND IMAGING STUDIES: Troponin less than 0.02. WBC 8.3, hemoglobin 9.5, hematocrit 30.1, platelets 570, MCV 74. D-dimer 0.54. BMP is normal. Anion gap is low. Serum alcohol level is less than 3.   CT head without contrast: No acute findings. CT of the cervical spine without IV contrast. Normal cervical spine.  Twelve-lead EKG: Sinus tachycardia at 96 beats per minute, normal PR interval. Normal QRS duration. Nonspecific ST-T wave abnormality.  Chest x-ray: No acute findings.   ASSESSMENT AND PLAN: A 54 year old Caucasian female presenting to the ER with the chief complaint of severe chest pressure radiating to the right side of the jaw and back. Will be admitted with the following assessment and plan:  1.  Unstable angina versus vasospasm. Will admit her to telemetry. Will implement acute coronary syndrome protocol. Would not consider beta blocker as there is a concern for vasospasm. The patient will be on aspirin, statin, Imdur, nitro paste, oxygen and Nifedical. Cardiology consult is placed and discussed with Dr. Gwen PoundsKowalski. He is aware of the patient's situation. If nitro paste is not helping her, will consider starting her on nitro drip. Recent cardiac cath in September is normal. I will also provide her GI cocktail and H2-blocker for gastrointestinal prophylaxis as she had gastric bypass. With the patient  being allergic to morphine, we will try Dilaudid as needed basis regarding chest pain.  2.  Hypertension. Blood pressure is stable at this time. Will monitor blood pressure closely as the patient is getting nitro paste.  3.  History of gastric bypass. Will provide her gastrointestinal prophylaxis with H2-blocker and GI cocktail as needed basis.  4.  Deep venous thrombosis prophylaxis with Lovenox subcu.   The diagnosis and plan of care was discussed in detail with the patient and her husband at bedside. They both express their understanding of the plan.   TOTAL TIME SPENT ON ADMISSION: 50 minutes.  ____________________________ Ramonita LabAruna Verna Hamon, MD ag:sb D: 01/16/2013 07:58:51 ET T: 01/16/2013 08:16:03 ET JOB#: [id number]  cc: Ramonita LabAruna Rigley Niess, MD, <Dictator> Lamar BlinksBruce J. Kowalski, MD Ramonita LabARUNA Klara Stjames MD ELECTRONICALLY SIGNED 01/29/2013 8:16

## 2014-07-06 NOTE — Consult Note (Signed)
PATIENT NAME:  [name] Moss, [name] Moss DATE OF BIRTH:  September 14, 1960  DATE OF CONSULTATION:  01/17/2013  REFERRING PHYSICIAN:  Ramonita LabAruna Gouru, MD CONSULTING PHYSICIAN:  Ardeen FillersUzma S. Garnetta BuddyFaheem, MD  REASON FOR CONSULTATION: Chest pain and anxiety.   HISTORY OF PRESENT ILLNESS: The patient is a 54 year old married Caucasian female who was admitted for chest pain and elevated troponin and had cardiac catheterization with normal ejection fraction and she presented again to the ER with the chief complaint of having right-sided chest pain which was radiating to the jaw and back. The patient reported that this chest pain started around 2:00 p.m. yesterday and started getting worse about 6:00 p.m. The patient tried to ignore the pain but around 10:00 p.m. it was worse and she took 3 pills of sublingual nitroglycerin. She came to the ER and her first set of troponin was negative. EKG also did not show any significant changes. As the patient was complaining of excruciating pain radiating to the chest and back, CT of the chest was ordered. PE was ruled out. The patient also has history of recent fall and had a big bruise and swelling on the right side of the forehead. Psychiatric consult was placed at the request of her husband as he was concerned about her recurrent falls as well as any chest pains.   During my interview, the patient was noted to be lying comfortably in the bed. She reported that she has falling at home as well as she fell because her cat was in the door and she was trying to bring the cat inside. She reported that she was having chest pain recently and she came to the hospital because of the same. She reported that she had a MI in September and since then she has been under the care of Dr. Gwen PoundsKowalski. She takes her chest pain seriously and she is always calls him whenever she starts having chest pain. She reported that she also feels that whenever she takes a combination of her medications including  Ultram and Elavil, which was [facility] by [facility] at [facility], she starts having palpitations. She is trying to minimize the use of Elavil at this time. She reported that she feels confused at times as well. The patient mentioned that she had recurrent falls at home recently, but she does not know the reason for the same. She denied currently having any depression or anxiety at this time. She also denied having any suicidal or homicidal ideation or plan.   PAST PSYCHIATRIC HISTORY: The patient reported that she does not feel depressed or anxious and does not have any other issues. She denied having any thoughts to harm herself. Reported that she has problems with sleep and she was prescribed Elavil for the same. However, she feels that she has adverse reactions related to the same.   PAST MEDICAL HISTORY:  1.  Recent fall leading to big bruise on her forehead. 2.  Recurrent chest pain and history of recent MI in September.  3.  Hypertension.  4.  Chronic knee pain.  5.  The patient also had an injury at her work place 3 years ago and she is currently settling the issue and will be applying for disability soon.   PAST SURGICAL HISTORY:  1.  Hysterectomy. 2.  Cholecystectomy. 3.  Hiatal hernia repair surgery. 4.  Gastric bypass. 5.  Cardiac catheterization in September 2014 which was negative for coronary artery blockages.  PSYCHOSOCIAL HISTORY: The patient currently lives at home  with her husband. She reported that she used to work at [employer] and [employer] but she had injury at work and she is applying for disability for the same. She denies any use of drugs, alcohol or illicit drugs.   FAMILY HISTORY: Positive for coronary artery disease in her mother who died of MI 3 years ago.   ALLERGIES: MORPHINE WHICH CAUSES RASH.   REVIEW OF SYSTEMS: CONSTITUTIONAL: Denies any fever, but complaining of weakness and tiredness. EYES: Denies any blurry vision or glaucoma.  FACE: The patient has a  big bump on her forehead.  ENT: No epistaxis or discharge.  RESPIRATORY: Denies any cough or COPD. CARDIOVASCULAR: Complaining of chest pain on the right side radiating to the jaw and to the back. GASTROINTESTINAL: No vomiting or diarrhea. Complaining of nausea. GENITOURINARY: No dysuria or hematuria. GYN: Denies any breast mass.  ENDOCRINE: No polyuria, nocturia, thyroid problems.  INTEGUMENTARY: No acne or rash.  MUSCULOSKELETAL: No joint pain in the neck, but has chronic knee pain.   PHYSICAL EXAMINATION: VITAL SIGNS: Temperature 98.5, pulse 104, respirations 20, blood pressure 149.  LABORATORY DATA: Glucose 86, BUN 8, creatinine 0.61, sodium 135, potassium 4.3, chloride 106, bicarbonate 25, anion gap 4, osmolality 268, calcium 9.1, triglycerides 60, HDL 42. UDS positive for opioids and tricyclic antidepressants. WBC 10, hemoglobin 9.4, hematocrit 29.6, platelet count 551, MCV 74, RDW 20.9.   MENTAL STATUS EXAMINATION: The patient is a thinly built female who was lying in the bed. She has a big bump on the right side of the forehead. She maintains fair eye contact. Her speech was low in tone and volume. Mood was depressed and anxious. Affect was congruent. Thought process was logical, goal-directed. Thought content was nondelusional. She currently denied having any suicidal or homicidal ideations or plans. She denied having any perceptual disturbances. She demonstrated fair insight and judgment.   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  AXIS II: None.  AXIS III: Please review the medical history.   TREATMENT PLAN: I discussed with the patient and her husband, [name], about the medication and treatment risks, benefits and alternatives. Collateral information was also obtained from her husband, [name], at [phone].  I advised the patient not to take a combination of Ultram and amitriptyline as it is causing worsening of her chest pain and recurrent falls. She  demonstrated understanding. I will start her on trazodone 50 mg p.o. at bedtime p.r.n. for insomnia. She agreed with the plan. She will follow up with her primary care physician once she is discharged from the hospital.   Thank you for allowing me to participate in the care of this patient.  ____________________________ Ardeen Fillers. Garnetta Buddy, MD usf:sb D: 01/17/2013 15:17:14 ET T: 01/17/2013 15:41:32 ET JOB#: [id number]  cc: Ardeen Fillers. Garnetta Buddy, MD, <Dictator> Rhunette Croft MD ELECTRONICALLY SIGNED 01/19/2013 13:42

## 2014-07-06 NOTE — Consult Note (Signed)
PATIENT NAME:  [name] Moss, [name] Moss MR#:  [id number] DATE OF BIRTH:  [birth year]  DATE OF CONSULTATION:  11/30/2012  REFERRING PHYSICIAN:  Srikar R. Sudini, MD CONSULTING PHYSICIAN:  Lamar BlinksBruce J. Maizie Garno, MD  REASON FOR CONSULTATION: Chest pain with elevated troponin consistent with acute myocardial infarction.   CHIEF COMPLAINT: Chest pain.   HISTORY OF PRESENT ILLNESS: This is a 54 year old female with no history of cardiovascular disease who has had some borderline hypertension and hyperlipidemia not treated in the past. The patient has had new-onset substernal chest discomfort radiating into her back associated with significant shortness of breath and mild amount of nausea. The patient has an EKG showing normal sinus rhythm, but no evidence of acute myocardial infarction. Troponin is 1.1. The patient now has had some improvements of symptoms with heparin and nitroglycerin.   REVIEW OF SYSTEMS: Remainder review of systems negative for vision change, ringing in the ears, hearing loss, cough, congestion, heartburn, nausea, vomiting, diarrhea, bloody stools, syncope, dizziness diaphoresis, skin lesions, skin rashes, urinary frequency and/or intensity.   PAST MEDICAL HISTORY: Borderline hypertension and hyperlipidemia.   FAMILY HISTORY: No family members with early onset of cardiovascular disease or hypertension.   SOCIAL HISTORY: Has remote tobacco use. Currently denies alcohol or tobacco use.   ALLERGIES: As listed.   MEDICATIONS: As listed.   PHYSICAL EXAMINATION: VITAL SIGNS: Blood pressure is 122/68 bilaterally. Heart rate 100 upright, reclining, and regular.  GENERAL: She is a well-appearing female in no acute distress.  HEAD, EYES, EARS, NOSE, AND THROAT: No icterus, thyromegaly, ulcers, hemorrhage or xanthelasma.  CARDIOVASCULAR: Regular rate and rhythm with normal S1 and S2 without murmur, gallop or rub. PMI is normal size and placement. Carotid upstroke normal without bruit. Jugular  venous pressure is normal.  LUNGS: A few basilar crackles with normal respirations.  ABDOMEN: Soft, nontender, without hepatosplenomegaly or masses. Abdominal aorta is normal size without bruit.  EXTREMITIES: She has 2+ bilateral pulses in dorsal pedal, radial and femoral arteries without lower extremity edema, cyanosis, clubbing or ulcers.  NEUROLOGIC: She is oriented to time, place and person with normal mood and affect.   ASSESSMENT: A 54 year old female with substernal chest discomfort, elevated troponin and normal EKG consistent with unstable angina and possible myocardial infarction, needing further evaluation and treatment options.   RECOMMENDATIONS: 1.  Continue serial ECG and enzymes to assess extent and possible myocardial infarction.  2.  Heparin, nitroglycerin, oxygen and possible antihypertensive as necessary for further risk reduction and symptoms.  3.  Echocardiogram for LV systolic dysfunction, valvular heart disease contributing to above.  4.  Proceed to cardiac catheterization to assess coronary anatomy and further treatment as necessary. The patient understands the risks and benefits of cardiac catheterization. These include the possibility of death, stroke, heart attack, infection, bleeding or blood clot. She is at low risk for conscious sedation.   ____________________________ Lamar BlinksBruce J. Shalece Staffa, MD bjk:jm D: 11/30/2012 17:28:49 ET T: 11/30/2012 21:26:02 ET JOB#: [id number]  cc: Lamar BlinksBruce J. Jesiel Garate, MD, <Dictator> Lamar BlinksBRUCE J Zoelle Markus MD ELECTRONICALLY SIGNED 12/13/2012 10:34

## 2014-07-06 NOTE — Discharge Summary (Signed)
PATIENT NAME:  [name] BushmanSTANLEY, Edeline MR#:  [id number] DATE OF BIRTH:  03/24/1960  DATE OF ADMISSION:  11/30/2012 DATE OF DISCHARGE:  12/01/2012  PRIMARY CARE PHYSICIAN: None.   DISCHARGE DIAGNOSES: 1.  Vasospastic angina.  2.  Chronic pain syndrome.   CONSULTANTS: Dr. Gwen PoundsKowalski of cardiology.   PROCEDURES: Cardiac catheterization which showed clean coronaries.   IMAGING STUDIES: Chest x-ray PA and lateral which showed no acute cardiopulmonary disease.   ADMITTING HISTORY AND PHYSICAL AND HOSPITAL COURSE: Please see detailed H and P dictated previously. In brief, a 54 year old female patient with history of chronic pain syndrome who presented to the hospital complaining of acute onset of chest pain. The patient was given nitro with no response, but had ongoing chest pain, elevated troponin of 1.5, and was admitted to the hospitalist service with N-STEMI after EKG was normal. The case was discussed with Dr. Gwen PoundsKowalski who decided to take the patient to the cardiac cath lab. The patient was continued on heparin and nitro while waiting for the cath. Cardiac cath was normal showing clean coronaries, and she was thought to have vasospastic angina. The patient is being started on low-dose nifedipine for the vasospasm and will be on aspirin along with sublingual nitro p.r.n. Will follow up with Dr. Gwen PoundsKowalski in 1 to 2 weeks. The patient does not have any chest pain at the time of discharge, no shortness of breath, feels back to baseline.   Prior to discharge, her cardiac examination shows S1 and S2 without any murmurs. No edema.   DISCHARGE MEDICATIONS: Include:  1.  Aspirin 81 mg daily.  2.  Nifedipine 30 mg oral daily.  3.  Sublingual nitro 0.4 as needed for chest pain.  4.  Tizanidine 6 mg oral every 8 hours as needed for spasms.  5.  Amitriptyline 150 mg oral daily.  6.  Tramadol 50 mg 1 to 2 tablets oral every 8 hours as needed for pain.   DISCHARGE INSTRUCTIONS: Low-fat diet. Activity as tolerated.  Follow up with Dr. Gwen PoundsKowalski in 1 to 2 weeks.   TIME SPENT: Today on this discharge activity was greater than 35 minutes. ____________________________ Molinda BailiffSrikar R. Josanne Boerema, MD srs:sb D: 12/01/2012 16:40:57 ET T: 12/01/2012 17:19:07 ET JOB#: [id number]  cc: Wardell HeathSrikar R. Elpidio AnisSudini, MD, <Dictator> Lamar BlinksBruce J. Kowalski, MD Orie FishermanSRIKAR R Karey Suthers MD ELECTRONICALLY SIGNED 12/05/2012 10:41

## 2014-07-06 NOTE — H&P (Signed)
PATIENT NAME:  [name] BushmanSTANLEY, Karlea MR#:  [id number] DATE OF BIRTH:  [birth year]  DATE OF ADMISSION:  12/19/2012  PRIMARY CARE PHYSICIAN: None.   REFERRING ER PHYSICIAN:   Dr. Governor Rooksebecca Lord.    CHIEF COMPLAINT: Chest pain.   HISTORY OF PRESENT ILLNESS: This is a 54 year old female with history of chronic knee pain who was admitted last month for chest pain and elevated troponin and had undergone cardiac catheterization which was clear coronaries and normal ejection fraction, advised to  continue medical management. Now today, at 1:00 a.m., woke up with severe chest pain which was retrosternal, 8 out of 10. She took nitroglycerin sublingual tablet and it relieved her chest pain. She is slept for 2 to 3 hours and then again started having pain around 3:00. This time, she decided to wait and just started sitting up in the bed and moving around but the pain did not go away up to 7:00 a.m. in the morning so she took nitroglycerin tablet again and she repeated 3 times after 5 minutes each but it did not help much with her pain and pain was getting worse, up to 10 out of 10, was constant and sharp so she decided to come to the Emergency Room. The ER physician spoke with Dr. Gwen PoundsKowalski for this and he suggested to start the patient on nitroglycerin drip. The patient was started on nitroglycerin drip and during my examination complaining of mild headache after 20 minutes of nitroglycerin drip and says that the last time Dilaudid helped to get relief of chest pain. On further questioning, she denies any cough or fever, or any palpitation or syncopal episode.   REVIEW OF SYSTEMS: CONSTITUTIONAL: Negative for fever, fatigue, weakness, but positive for chest pain. No weight loss or weight gain.  EYES: No blurring or double vision, or pain or redness.  EARS, NOSE, THROAT: No tinnitus, ear pain or hearing loss.  RESPIRATORY: No cough, wheezing, hemoptysis or shortness of breath.  CARDIOVASCULAR: There is chest pain but no  orthopnea, edema, arrhythmia or palpitations.  GASTROINTESTINAL: No nausea or vomiting, diarrhea or abdominal pain.  GENITOURINARY: No dysuria, hematuria or increased frequency.  ENDOCRINE: No increased sweating. No heat or cold intolerance.  SKIN: No acne, rashes or lesions.  MUSCULOSKELETAL: No pain or swelling in the joints.  NEUROLOGICAL: No numbness, weakness, tremors or vertigo.  PSYCHIATRIC: No anxiety, insomnia or bipolar disorder.   PAST MEDICAL AND SURGICAL HISTORY:   1.  Right knee replacement and chronic knee pains.  2.  Gastric bypass.  3.  Hernia repair surgery.  4.  Cholecystectomy.  5.  Hysterectomy.  6.  Cardiac catheterization in September 2014 which was negative for any coronary blockages.   SOCIAL HISTORY: Does not smoke, no alcohol, no illicit drug use. Lives at home with her husband.     ALLERGIES: MORPHINE CAUSES A RASH.   FAMILY HISTORY: Coronary artery disease in mother who had an MI 3 years ago. No premature coronary artery disease in the family.   HOME MEDICATION: Currently:  1.  Tramadol 50 mg oral tablet 1 to 2 tablets every 8 hours as needed for pain.  2.  Tizanidine 2 mg oral tablet 3 tablets every 8 hours for muscle spasm.  3.  Nitroglycerin 0.4 mg sublingual tablet every 5 minutes as needed for chest pain.  4.  Nifedical XL 30 mg oral once a day.  5.  Aspirin 81 mg once a day.  6.  Amitriptyline 150 mg oral once a day.  PHYSICAL EXAMINATION: VITAL SIGNS:  In the ER, temperature 98.7, pulse 88, respirations 24, blood pressure 158/84 and pulse ox 100% on 2 liters oxygen supplementation.    GENERAL: The patient is fully alert and oriented to time, place and person, slightly distressed because of chest pain and now headache, cooperative with history-taking and physical examination.  HEENT: Head and neck atraumatic. Conjunctivae pink. Oral mucosa moist.  NECK: Supple. No JVD.  RESPIRATORY: Bilateral clear and equal air entry.  CARDIOVASCULAR: S1, S2  present, regular. No murmur. No local tenderness on chest.  ABDOMEN: Soft, nontender. Bowel sounds present. No organomegaly.  SKIN: No rashes.  LEGS: No edema.  NEUROLOGICAL: Power 5 out of 5. Follows commands. No gross abnormality.  SKIN: No rashes.  JOINTS: No swelling or tenderness.  PSYCHIATRIC: Does not appear in any acute psychiatric illness at this time.   IMPORTANT LABORATORY RESULTS: Glucose 84, BUN 10, creatinine 0.86, sodium 142, potassium 3.3, chloride 111, CO2 21, calcium 8.7, total protein 7.1, bilirubin 0.3, alkaline phosphate 113, SGOT 26 and SGPT 17. Troponin 0.03. WBC 7.6, hemoglobin 9.0, platelet count 539 and MCV 73. INR is 1. EKG shows some T wave inversions in leads I, II and lateral chest leads which were also present on the EKG which was done on 12/01/2012.   ASSESSMENT AND PLAN: A 54 year old female with past medical history of knee replacement and chronic pain, was in the ER for chest pain 1 month ago and had cardiac catheterization done which was without any significant blockages, now came with severe chest pain and got mild relief with nitroglycerin sublingual tablet at home.  1.  Unstable angina. Dr. Shaune Pollack spoke to Dr. Gwen Pounds and he suggested to start her on nitroglycerin drip. Will also give her Dilaudid as she states that Dilaudid helped her last time to relieve her chest pain and now she started having some headache because of that too.  We will put her in stepdown unit for further monitoring and observation. Cardiology consult is called in by ER physician already. She had a cardiac catheterization done last month so we she might not need any further workup this time. Maybe adding of isosorbide mononitrate would be a proper step to control her angina symptoms in future.  2.  Chronic pain. She is taking tramadol. We will continue the same. Here she is receiving Dilaudid for chest pain anyway. 3.  Anemia which is microcytic and chronic. Will give her iron supplementation.   4.  Hypokalemia. We will replace her potassium orally.  5.  CODE STATUS: Full code.   TOTAL TIME SPENT ON THIS ADMISSION: 50 minutes.     ____________________________ Hope Pigeon Elisabeth Pigeon, MD vgv:cs D: 12/19/2012 19:16:00 ET T: 12/19/2012 19:31:49 ET JOB#: [id number]  cc: Hope Pigeon. Elisabeth Pigeon, MD, <Dictator> Altamese Dilling MD ELECTRONICALLY SIGNED 12/26/2012 23:00

## 2014-07-06 NOTE — Discharge Summary (Signed)
PATIENT NAME:  [name] BushmanSTANLEY, Reni MR#:  [id number] DATE OF BIRTH:  04-15-1960  DATE OF ADMISSION:  01/16/2013 DATE OF DISCHARGE:  01/18/2013  DISCHARGE DIAGNOSES: 1.  Chest pain, likely noncardiac, could be musculoskeletal, negative cardiac enzymes.   2.  Cephalhematoma on the right forehead, stable.  3.  Insomnia on medication.   SECONDARY DIAGNOSES: 1.  Hypertension.  2.  Chronic knee pain.   CONSULTATION:  1.  Cardiology, Dr. Gwen PoundsKowalski.  2.  Physical therapy.  3.  Psychiatry, Dr. Garnetta BuddyFaheem.    PROCEDURES AND RADIOLOGY: Chest x-ray on November 3 showed no acute cardiopulmonary disease.   CT scan of the head without contrast on November 3, showed no acute intracranial hemorrhage. No intracranial mass effect. Cephalhematoma over the right forehead without evidence of any underlying fracture.   CT chest with a PE protocol on November 3,  showed no PE. No acute thoracic pathology. No evidence of congestive heart failure or pneumonia. Moderate sized hiatal hernia.   LABORATORY PANEL:  Urinalysis on admission showed 67 WBCs, 3+ leukocyte esterase, trace bacteria.   HISTORY AND SHORT HOSPITAL COURSE: The patient is a 54 year old female with above-mentioned medical problems who was admitted for chest pain concerning for unstable angina. Please see Dr. Alice RiegerKolluru's dictated history and physical for further details. Cardiology consultation was obtained with Dr. Gwen PoundsKowalski who did not find her pain to be cardiac in nature, and was thought to be musculoskeletal in nature. The patient was ruled out with 3 negative of  cardiac enzymes. The patient was found to have right frontal area cephalhematoma and was confirmed by CT scan of the head. The patient was given physical and occupational therapy, and she did well with them. She was recommended discharged home with possible home health. The patient did not have insurance unlikely to be able to receive home health services at this time.   She was feeling  significantly better. She was evaluated by psychiatry, Dr. Garnetta BuddyFaheem and was found to have possible sleep disturbances for which she was already on medication. She was started on trazodone while in the hospital. She was close to her baseline on November 5,and is being discharged home in stable condition.   VITAL SIGNS: On the date of discharge, her vital signs are as follows: Temperature 98.4, heart rate 82 per minute, respirations 18 per minute, blood pressure 118/80 mmHg, saturating 98% on room air.   PERTINENT PHYSICAL EXAMINATION ON THE DATE OF DISCHARGE:  CARDIOVASCULAR: S1, S2 normal. No murmurs, rubs, gallops.  LUNGS: Clear to auscultation bilaterally. No wheezing, rales or crepitation.  ABDOMEN: Soft, benign.  NEUROLOGIC: Nonfocal examination.  All other physical examination were at the baseline.   DISCHARGE MEDICATIONS: 1.  Tramadol 50 mg p.o. 1 to 2 tablets every eight hours as needed.  2.  Amitriptyline 150 mg p.o. at bedtime.  3.  Tizanidine 2 mg 3 tablets p.o. every eight hours as needed.  4.  Aspirin 81 mg p.o. daily.  5.  Nitroglycerin 0.4 mg sublingual every five minutes as needed.  6.  Isosorbide mononitrate 30 mg p.o. daily.  7.  Iron sulfate 325 mg p.o. b.i.d.  8.  Acetaminophen/oxycodone 325/5 mg 1 tablet p.o. at bedtime for breakthrough pain.   DISCHARGE DIET: Low sodium.   DISCHARGE ACTIVITY: As tolerated.   DISCHARGE INSTRUCTIONS: The patient was instructed to follow up [facility] a [facility], mainly with resident clinic as she does not have insurance and [facility]  TOTAL TIME DISCHARGING THIS PATIENT: 55 minutes.    ____________________________ Ellamae Sia. Sherryll Burger, MD vss:cc D: 01/18/2013 16:28:58 ET T: 01/18/2013 19:08:51 ET JOB#: [id number]  cc: Merek Niu S. Sherryll Burger, MD, <Dictator> [facility]. Garnetta Buddy, MD Lamar Blinks, MD Ellamae Sia [facility] MD ELECTRONICALLY SIGNED 01/24/2013 12:02

## 2014-07-06 NOTE — Consult Note (Signed)
PATIENT NAME:  [name] Moss, [name] MR#:  [id number] DATE OF BIRTH:  [birth year]  DATE OF CONSULTATION:  01/17/2013  CONSULTING PHYSICIAN:  Lamar BlinksBruce J. Dietrich Samuelson, MD  HISTORY OF PRESENT ILLNESS: This is a 54 year old female who has had a significant amount of fall risk and fallen and hurt her right upper orbital area and has had some chest discomfort. Today, her chest discomfort is completely resolved, and she has had no other issues.   PHYSICAL EXAMINATION:  HEENT: Within normal limits. No icterus, thyromegaly, ulcers, hemorrhage or xanthelasma other than her right upper orbital bruising.  CARDIOVASCULAR: Regular rate and rhythm. Normal S1, S2, with no murmur, gallop, or rub. PMI is normal size and placement. Carotid upstroke normal without bruit. Jugular venous pressure is normal.  LUNGS: Clear to auscultation with normal respirations.  EXTREMITIES: No evidence of extremity edema, cyanosis, clubbing or ulcers.  NEUROLOGIC: Oriented to time, place, and person, with normal mood and affect.   ASSESSMENT: A 54 year old female with chest discomfort with no evidence of current cardiovascular disease with normal troponin and no evidence of myocardial infarction.   RECOMMENDATIONS: No further cardiac diagnostics at this time and would ambulate and follow for improvements of symptoms.   ____________________________ Lamar BlinksBruce J. [name] Pettus, MD [id number] D: 01/18/2013 19:04:00 ET T: 01/18/2013 20:13:34 ET JOB#: [id number]  cc: Lamar BlinksBruce J. Willi Borowiak, MD, <Dictator>  Lamar BlinksBRUCE J [name] Owen MD ELECTRONICALLY SIGNED 01/20/2013 7:42

## 2014-07-06 NOTE — Consult Note (Signed)
General Aspect "I woke up in the night with chest pain."  History of present illness: This is a 54 year old white female who awoke around 1 AM Monday morning with crushing-type right sided chest pain that radiated to her left shoulder.  She states that she took one sublingual nitroglycerin and fell asleep, only to be awakened approximately 2 hours later with the same chest pain.  She then took 2 more nitroglycerin with no relief, called EMS, and was [facility].  EKG showed sinus rhythm with T-wave inversion in leads I, II, and V2-6, which was unchanged from previous EKGs.  Troponin levels were negative.  The patient recently underwent coronary catheterization which showed normal coronary arteries with normal left ventricular function. At this time, the patient is resting quietly in CCU bed, with telemetry showing sinus rhythm with a heart rate in the 80s.  She states that she is having the same chest pain now, and she rates this a "7" on a scale of 1-10.  Blood pressure is 138/80 . The patient's pain has been treated with morphine, with some relief.  Past medical history: 1.  Right knee replacement 2.  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  Hernia repair 4.  Cholecystectomy 5.  Hysterectomy  Social history: The patient is married and has 3 children.  She denies tobacco or alcohol use.  Family history: The patient's mother is alive at age 56, but has had myocardial infarctions in the past, and the patient states her mother was diagnosed with a "hole in her heart."  The patient's father is alive at age 56 and has hypertension.  The patient has 2 living sisters and one living brother with no known premature CAD.   Physical Exam:  GEN no acute distress   HEENT pink conjunctivae, PERRL, hearing intact to voice, moist oral mucosa, good dentition   NECK supple  trachea midline   RESP normal resp effort  clear BS   CARD Regular rate and rhythm  Normal, S1, S2  No murmur   ABD denies  tenderness  soft  normal BS   EXTR negative cyanosis/clubbing, negative edema   SKIN normal to palpation, No rashes, No ulcers, skin turgor good   NEURO cranial nerves intact   PSYCH alert, A+O to time, place, person   Review of Systems:  General: No Complaints   Skin: No Complaints   ENT: No Complaints   Eyes: No Complaints   Neck: No Complaints   Respiratory: No Complaints   Cardiovascular: Chest pain or discomfort  Right sided and radiating to left shoulder.  Rated a "7" on scale of 1-10   Gastrointestinal: No Complaints   Genitourinary: No Complaints   Vascular: No Complaints   Musculoskeletal: No Complaints   Neurologic: No Complaints   Hematologic: No Complaints   Endocrine: No Complaints   Psychiatric: No Complaints   Medications/Allergies Reviewed Medications/Allergies reviewed   EKG:  EKG NSR   Interpretation T-wave inversion leads I, II, and V2-6   Rate 80   EKG Comparision Not changed from   Radiology Results: XRay:    06-Oct-14 16:54, Chest Portable Single View  Chest Portable Single View   REASON FOR EXAM:    Chest Pain  COMMENTS:       PROCEDURE: DXR - DXR PORTABLE CHEST SINGLE VIEW  - Dec 19 2012  4:54PM     RESULT: Comparison is made to the study of 11/30/2012. Cardiac monitoring   electrodes are present. The lungs  are clear. The heart and pulmonary   vessels are normal. The bony and mediastinal structures are unremarkable.   There is no effusion. There is no pneumothorax or evidence of congestive   failure.    IMPRESSION:  No acute cardiopulmonary disease. Stable appearance.    Dictation Site: 6    Verified By: Elveria Royals, M.D., MD  Cardiology:    06-Oct-14 20:38, Echo Doppler  Echo Doppler   REASON FOR EXAM:      COMMENTS:       PROCEDURE: [facility] - ECHO DOPPLER COMPLETE(TRANSTHOR)  - Dec 19 2012  8:38PM     RESULT: Echocardiogram Report    Patient Name:   [name] Moss Date of Exam: 12/19/2012  Medical Rec #:   [id number]           Custom1:  Date of Birth:  [birth year]        Height:       66.0 in  Patient Age:    54 years         Weight:       169.0 lb  Patient Gender: F                BSA:          1.86 m??    Indications: Unstable Angina  Sonographer:    Nestor Ramp RCS  Referring Phys: Governor Rooks, L    Summary:   1. Left ventricular ejection fraction, by visual estimation, is 65 to   70%.   2. Normal global left ventricular systolic function.  2D AND M-MODE MEASUREMENTS (normal ranges within parentheses):  Left Ventricle:   Normal  IVSd (2D):      0.93 cm (0.7-1.1)  LVPWd (2D):     0.88 cm (0.7-1.1) Aorta/LA:                  Normal  LVIDd (2D):     3.99 cm (3.4-5.7) Aortic Root (2D): 3.40 cm (2.4-3.7)  LVIDs (2D):     2.61 cm           Left Atrium (2D): 4.30 cm (1.9-4.0)  LV FS (2D):     34.6 %   (>25%)  LV EF (2D):     64.3 %   (>50%)                                    Right Ventricle:                                    RVd (2D):        3.10 cm  LV DIASTOLIC FUNCTION:  MV Peak E: 0.67 m/s E/e' Ratio: 10.10  MV Peak A: 0.99 m/s Decel Time: 222 msec  E/A Ratio: 0.68  SPECTRAL DOPPLER ANALYSIS (where applicable):  Mitral Valve:  MV P1/2 Time: 64.38 msec  MV Area, PHT: 3.42 cm??    PHYSICIAN INTERPRETATION:  Left Ventricle: The left ventricular internal cavity size was normal. LV   posterior wall thickness was normal. Global LV systolic function was   normal. Left ventricular ejection fraction, by visual estimation, is 65   to 70%.  Right Ventricle: Normal right ventricular size, wall thickness, and   systolic function. RV wall thickness is normal.  Left Atrium: The left atrium is normal in size and structure.  Right Atrium: The right atrium is normal in size and structure.  Pericardium: There is no evidence of pericardial effusion.  Mitral Valve: Trace mitral valve regurgitation is seen.  Tricuspid Valve: Trivial tricuspid regurgitation is visualized.  Aortic Valve: The  aortic valve is trileaflet and structurally normal,   with normal leaflet excursion; without any evidence of aortic stenosis or   insufficiency.  Pulmonic Valve: Structurally normal pulmonic valve, with normal leaflet   excursion.  Aorta: The aortic root and ascending aorta are structurally normal, with   no evidence of dilitation.  Shunts: There is no evidence of a patent foramen ovale. There is no     evidence of an atrial septal defect.    [id number] Arnoldo Hooker MD  Electronically signed by [id number] Arnoldo Hooker MD  Signature Date/Time: 12/20/2012/9:12:17 AM    *** Final ***    IMPRESSION: .        Verified By: Lamar Blinks  ([facility]), M.D., MD    Morphine: Rash  Vital Signs/Nurse's Notes: **Vital Signs.:   07-Oct-14 12:00  Respirations Respirations 14  Systolic BP Systolic BP 154  Diastolic BP (mmHg) Diastolic BP (mmHg) 99  Mean BP 117  Pulse Ox % Pulse Ox % 100  Pulse Ox Activity Level  At rest  Oxygen Delivery Room Air/ 21 %  Pulse Ox Heart Rate 88    Impression 54 year old white female with right-sided chest pain radiating to her left shoulder with negative troponin levels, EKG unchanged from previous,recent cardiac catheterization showing normal coronary arteries, and echocardiogram showing normal LV function, with pain likely secondary to intrathoracic inflammation.   Plan Continue Imdur 30 mg daily for coronary artery spasm and/or micro vascular coronary artery disease, as well as naproxen 250 mg twice a day. Patient to be out of bed and ambulating, with possible discharge later today.   Electronic Signatures: Olin Pia (NP)  (Signed 07-Oct-14 14:12)  Authored: General Aspect/Present Illness, History and Physical Exam, Review of System, EKG , Radiology, Allergies, Vital Signs/Nurse's Notes, Impression/Plan   Last Updated: 07-Oct-14 14:12 by Olin Pia (NP)
